# Patient Record
Sex: Male | Born: 1980 | Race: White | Hispanic: No | Marital: Single | Smoking: Current every day smoker
Health system: Southern US, Community
[De-identification: ages and names within clinical notes are randomized; demographics above are authoritative.]

## PROBLEM LIST (undated history)

## (undated) DIAGNOSIS — F1910 Other psychoactive substance abuse, uncomplicated: Secondary | ICD-10-CM

## (undated) HISTORY — PX: EYE MUSCLE SURGERY: SHX370

---

## 2015-01-17 ENCOUNTER — Encounter: Payer: Self-pay | Admitting: Emergency Medicine

## 2015-01-17 ENCOUNTER — Emergency Department (INDEPENDENT_AMBULATORY_CARE_PROVIDER_SITE_OTHER): Payer: Self-pay

## 2015-01-17 ENCOUNTER — Emergency Department
Admission: EM | Admit: 2015-01-17 | Discharge: 2015-01-17 | Disposition: A | Discharge status: Home / Self Care | Payer: Self-pay | Source: Home / Self Care | Attending: Family Medicine | Admitting: Family Medicine

## 2015-01-17 DIAGNOSIS — R05 Cough: Secondary | ICD-10-CM

## 2015-01-17 DIAGNOSIS — J189 Pneumonia, unspecified organism: Secondary | ICD-10-CM

## 2015-01-17 DIAGNOSIS — J181 Lobar pneumonia, unspecified organism: Principal | ICD-10-CM

## 2015-01-17 DIAGNOSIS — R079 Chest pain, unspecified: Secondary | ICD-10-CM

## 2015-01-17 HISTORY — DX: Other psychoactive substance abuse, uncomplicated: F19.10

## 2015-01-17 MED ORDER — AZITHROMYCIN 250 MG PO TABS: ORAL_TABLET | ORAL | Status: AC

## 2015-01-17 NOTE — ED Notes (Signed)
Reports congestion and sore throat and headaches intermittently for 4-5 weeks.

## 2015-01-17 NOTE — Discharge Instructions (Signed)
Take plain guaifenesin (  extended release tabs such as Mucinex) twice daily, with plenty of water, for cough and congestion.  May add Pseudoephedrine ( , one or two every 4 to 6 hours) for sinus congestion.  Get adequate rest.   May take Delsym Cough Suppressant at bedtime for nighttime cough.   Follow-up with family doctor if not improving about10 days.    Community-Acquired Pneumonia, Adult Pneumonia is an infection of the lungs. There are different types of pneumonia. One type can develop while a person is in a hospital. A different type, called community-acquired pneumonia, develops in people who are not, or have not recently been, in the hospital or other health care facility.  CAUSES Pneumonia may be caused by bacteria, viruses, or funguses. Community-acquired pneumonia is often caused by Streptococcus pneumonia bacteria. These bacteria are often passed from one person to another by breathing in droplets from the cough or sneeze of an infected person. RISK FACTORS The condition is more likely to develop in:  People who havechronic diseases, such as chronic obstructive pulmonary disease (COPD), asthma, congestive heart failure, cystic fibrosis, diabetes, or kidney disease.  People who haveearly-stage or late-stage HIV.  People who havesickle cell disease.  People who havehad their spleen removed (splenectomy).  People who havepoor Administrator.  People who havemedical conditions that increase the risk of breathing in (aspirating) secretions their own mouth and nose.   People who havea weakened immune system (immunocompromised).  People who smoke.  People whotravel to areas where pneumonia-causing germs commonly exist.  People whoare around animal habitats or animals that have pneumonia-causing germs, including birds, bats, rabbits, cats, and farm animals. SYMPTOMS Symptoms of this condition include:  Adry cough.  A wet (productive)  cough.  Fever.  Sweating.  Chest pain, especially when breathing deeply or coughing.  Rapid breathing or difficulty breathing.  Shortness of breath.  Shaking chills.  Fatigue.  Muscle aches. DIAGNOSIS Your health care provider will take a medical history and perform a physical exam. You may also have other tests, including:  Imaging studies of your chest, including X-rays.  Tests to check your blood oxygen level and other blood gases.  Other tests on blood, mucus (sputum), fluid around your lungs (pleural fluid), and urine. If your pneumonia is severe, other tests may be done to identify the specific cause of your illness. TREATMENT The type of treatment that you receive depends on many factors, such as the cause of your pneumonia, the medicines you take, and other medical conditions that you have. For most adults, treatment and recovery from pneumonia may occur at home. In some cases, treatment must happen in a hospital. Treatment may include:  Antibiotic medicines, if the pneumonia was caused by bacteria.  Antiviral medicines, if the pneumonia was caused by a virus.  Medicines that are given by mouth or through an IV tube.  Oxygen.  Respiratory therapy. Although rare, treating severe pneumonia may include:  Mechanical ventilation. This is done if you are not breathing well on your own and you cannot maintain a safe blood oxygen level.  Thoracentesis. This procedureremoves fluid around one lung or both lungs to help you breathe better. HOME CARE INSTRUCTIONS  Take over-the-counter and prescription medicines only as told by your health care provider.  Only takecough medicine if you are losing sleep. Understand that cough medicine can prevent your body's natural ability to remove mucus from your lungs.  If you were prescribed an antibiotic medicine, take it as told by your health care  provider. Do not stop taking the antibiotic even if you start to feel  better.  Sleep in a semi-upright position at night. Try sleeping in a reclining chair, or place a few pillows under your head.  Do not use tobacco products, including cigarettes, chewing tobacco, and e-cigarettes. If you need help quitting, ask your health care provider.  Drink enough water to keep your urine clear or pale yellow. This will help to thin out mucus secretions in your lungs. PREVENTION There are ways that you can decrease your risk of developing community-acquired pneumonia. Consider getting a pneumococcal vaccine if:  You are older than 36 years of age.  You are older than 36 years of age and are undergoing cancer treatment, have chronic lung disease, or have other medical conditions that affect your immune system. Ask your health care provider if this applies to you. There are different types and schedules of pneumococcal vaccines. Ask your health care provider which vaccination option is best for you. You may also prevent community-acquired pneumonia if you take these actions:  Get an influenza vaccine every year. Ask your health care provider which type of influenza vaccine is best for you.  Go to the dentist on a regular basis.  Wash your hands often. Use hand sanitizer if soap and water are not available. SEEK MEDICAL CARE IF:  You have a fever.  You are losing sleep because you cannot control your cough with cough medicine. SEEK IMMEDIATE MEDICAL CARE IF:  You have worsening shortness of breath.  You have increased chest pain.  Your sickness becomes worse, especially if you are an older adult or have a weakened immune system.  You cough up blood.   This information is not intended to replace advice given to you by your health care provider. Make sure you discuss any questions you have with your health care provider.   Document Released: 02/21/2005 Document Revised: 11/12/2014 Document Reviewed: 06/18/2014 Elsevier Interactive Patient Education [email or web address]  [employer]

## 2015-01-17 NOTE — ED Provider Notes (Signed)
CSN: [id number]     Arrival date & time 01/17/15  1635 History   First MD Initiated Contact with Patient 01/17/15 1750     Chief Complaint  Patient presents with  . Nasal Congestion      HPI Comments: About five weeks ago patient developed typical cold-like symptoms including mild sore throat, sinus congestion, headache, fatigue, and myalgias.  He developed a cough two weeks ago that has persisted.  He has now developed fever/chills, shortness of breath, and wheezing with activity.  The history is provided by the patient.    Past Medical History  Diagnosis Date  . Substance abuse    Past Surgical History  Procedure Laterality Date  . Eye muscle surgery      childhood   History reviewed. No pertinent family history. Social History  Substance Use Topics  . Smoking status: Current Every Day Smoker  . Smokeless tobacco: None  . Alcohol Use: No    Review of Systems + sore throat + cough ? pleuritic pain + wheezing + nasal congestion + post-nasal drainage No sinus pain/pressure No itchy/red eyes No earache No hemoptysis + SOB with activity + fever, + chills No nausea No vomiting No abdominal pain No diarrhea No urinary symptoms No skin rash + fatigue + myalgias + headache Used OTC meds without relief  Allergies  Review of patient's allergies indicates no known allergies.  Home Medications   Prior to Admission medications   Medication Sig Start Date End Date Taking? Authorizing Provider  buprenorphine-naloxone (SUBOXONE) 2-0.5 MG SUBL SL tablet Place 1 tablet under the tongue daily.   Yes Historical Provider, MD  azithromycin (ZITHROMAX Z-PAK) 250 MG tablet Take 2 tabs today; then begin one tab once daily for 4 more days. 01/17/15   Lattie Haw, MD   Meds Ordered and Administered this Visit  Medications - No data to display  BP 132/83 mmHg  Pulse 107  Temp(Src) 99 F (37.2 C) (Oral)  Resp 16  Ht  (1.778 m)  Wt 225 lb (102.059 kg)  BMI 32.28  kg/m2  SpO2 95% No data found.   Physical Exam Nursing notes and Vital Signs reviewed. Appearance:  Patient appears stated age, and in no acute distress Eyes:  Pupils are equal, round, and reactive to light and accomodation.  Extraocular movement is intact.  Conjunctivae are not inflamed  Ears:  Canals normal.  Tympanic membranes normal.  Nose:  Mildly congested turbinates.  No sinus tenderness.    Pharynx:  Normal Neck:  Supple.  Tender enlarged posterior nodes are palpated bilaterally  Lungs:  Rales left posterior base.  Bilateral expiratory wheezes present.  Breath sounds are equal.  Moving air well. Heart:  Regular rate and rhythm without murmurs, rubs, or gallops.  Abdomen:  Nontender without masses or hepatosplenomegaly.  Bowel sounds are present.  No CVA or flank tenderness.  Extremities:  No edema.  No calf tenderness Skin:  No rash present.   ED Course  Procedures none   Imaging Review Dg Chest 2 View  01/17/2015  CLINICAL DATA:  Cough and chest pain with inspiration for 5 weeks. Initial encounter. EXAM: CHEST  2 VIEW COMPARISON:  None. FINDINGS: Airspace disease is seen in the left lung base. The right lung is clear. No pneumothorax or pleural effusion. Heart size is normal. IMPRESSION: Left basilar airspace disease could be secondary to pneumonia or atelectasis. Electronically Signed   By: Drusilla Kanner M.D.   On: 01/17/2015 18:46  MDM   1. Left lower lobe pneumonia    CAP; begin Z-pak for atypical coverage Take plain guaifenesin (1200mg  extended release tabs such as Mucinex) twice daily, with plenty of water, for cough and congestion.  May add Pseudoephedrine (30mg , one or two every 4 to 6 hours) for sinus congestion.  Get adequate rest.   May take Delsym Cough Suppressant at bedtime for nighttime cough.   Follow-up with family doctor if not improving about10 days.     Lattie HawStephen A Beese, MD 01/22/15 [id number]

## 2015-01-23 ENCOUNTER — Telehealth: Payer: Self-pay | Admitting: *Deleted

## 2016-08-21 IMAGING — CR DG CHEST 2V
2 series · 2 of 2 positions shown · non-contrast
Comparison: None.

CLINICAL DATA: Cough and chest pain with inspiration for 5 weeks.
Initial encounter.

EXAM:
CHEST  2 VIEW

[chest pa]
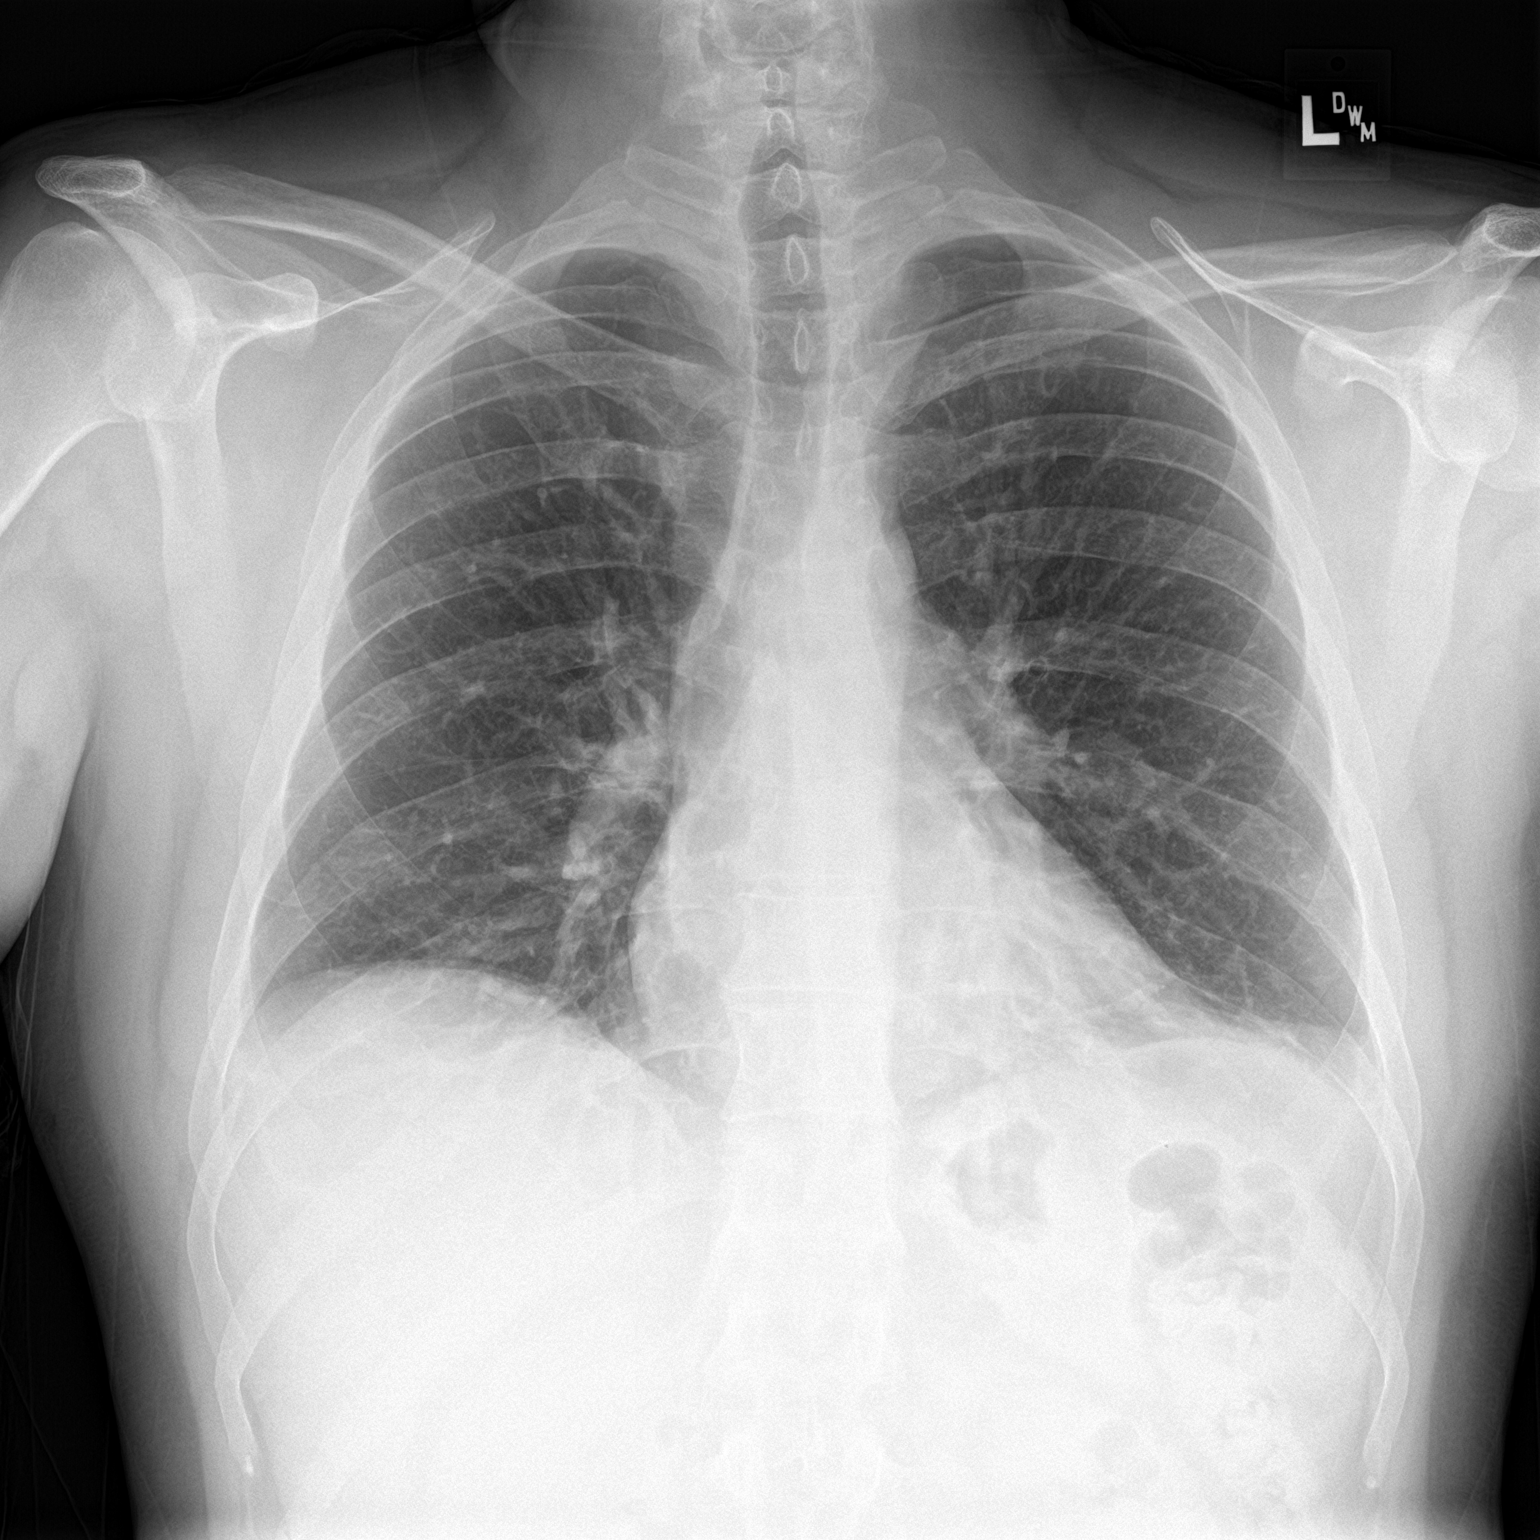

[chest lat]
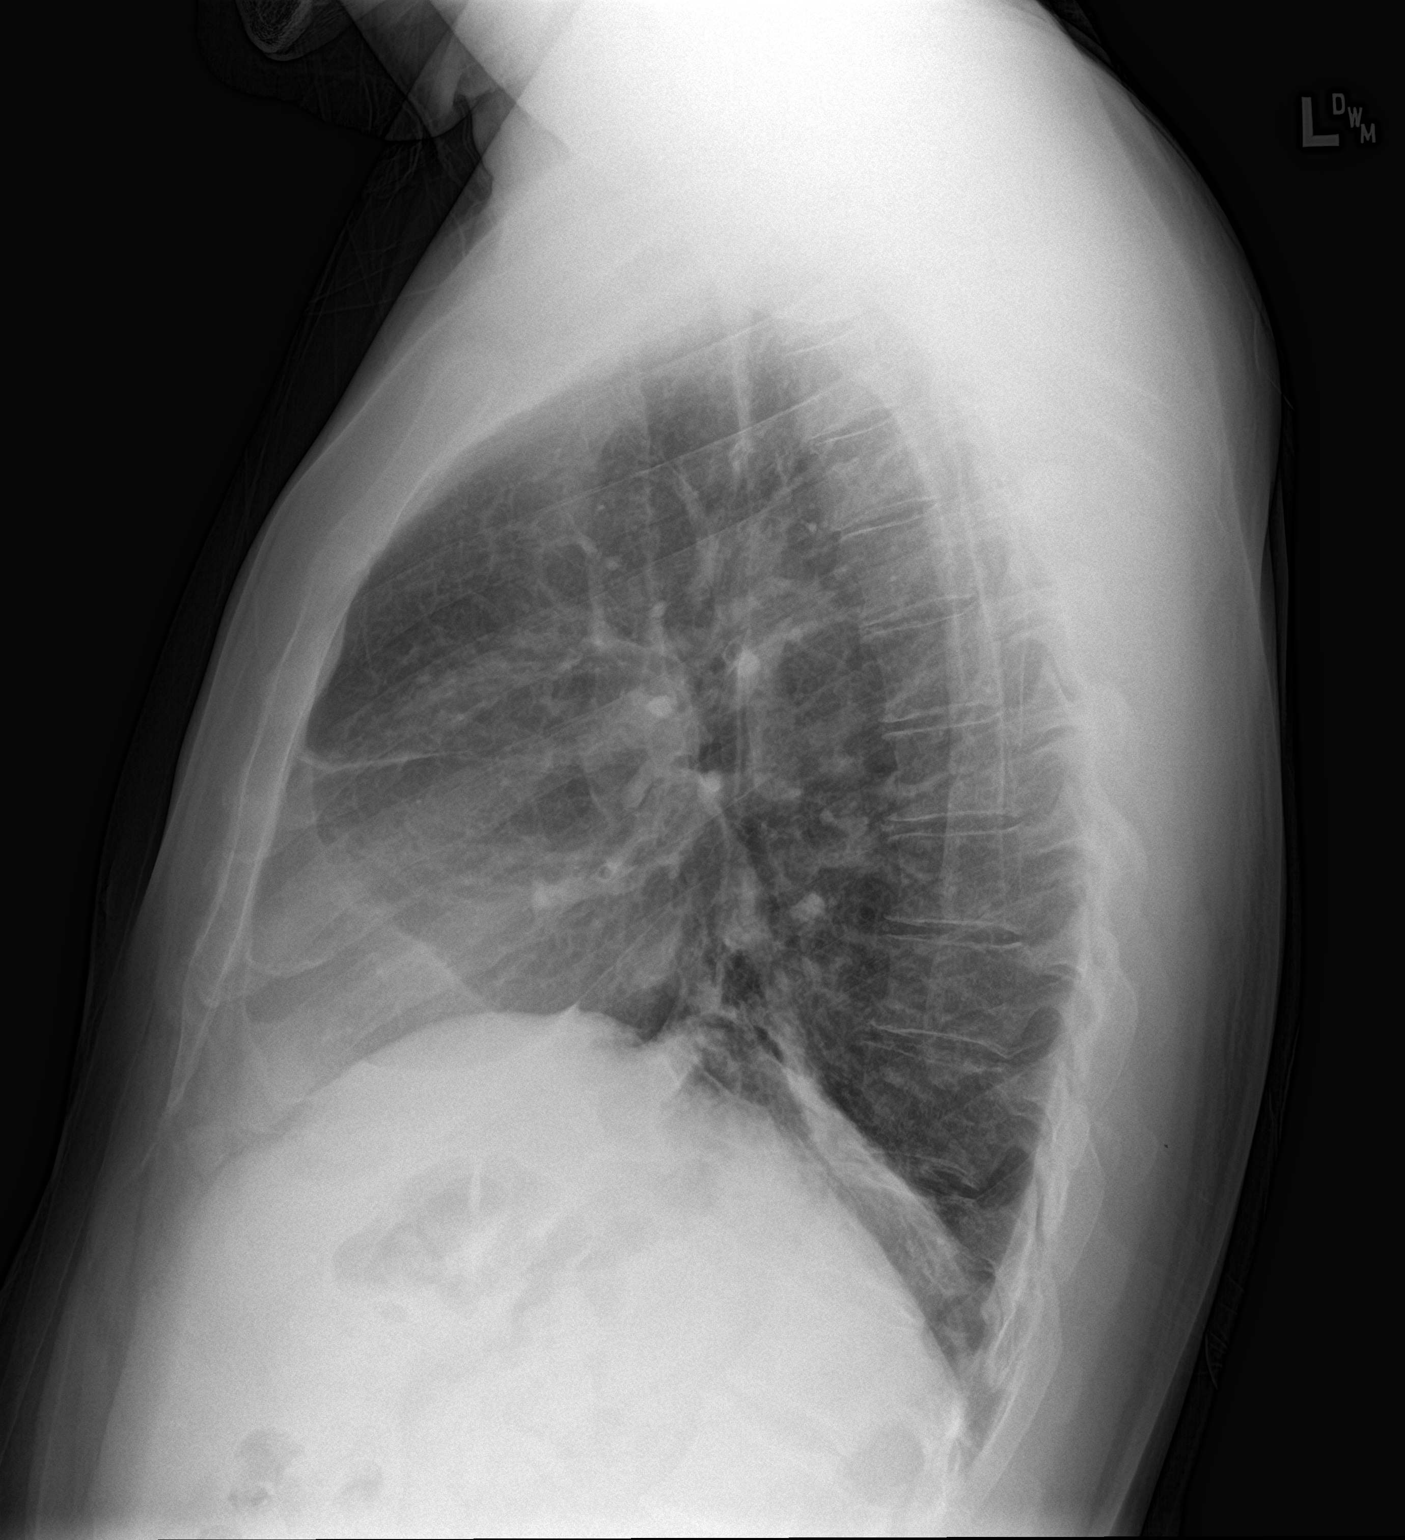

[2 of 2 positions shown; findings below may reference images not displayed]

FINDINGS: Airspace disease is seen in the left lung base. The right lung is
clear. No pneumothorax or pleural effusion. Heart size is normal.
IMPRESSION: Left basilar airspace disease could be secondary to pneumonia or
atelectasis.
